# Patient Record
Sex: Male | Born: 1960 | Race: White | Hispanic: No | Marital: Married | Smoking: Former smoker
Health system: Southern US, Community
[De-identification: ages and names within clinical notes are randomized; demographics above are authoritative.]

## PROBLEM LIST (undated history)

## (undated) DIAGNOSIS — I10 Essential (primary) hypertension: Secondary | ICD-10-CM

## (undated) DIAGNOSIS — F419 Anxiety disorder, unspecified: Secondary | ICD-10-CM

## (undated) DIAGNOSIS — K219 Gastro-esophageal reflux disease without esophagitis: Secondary | ICD-10-CM

---

## 2013-11-08 ENCOUNTER — Encounter (HOSPITAL_BASED_OUTPATIENT_CLINIC_OR_DEPARTMENT_OTHER): Payer: Self-pay | Admitting: Emergency Medicine

## 2013-11-08 ENCOUNTER — Observation Stay (HOSPITAL_BASED_OUTPATIENT_CLINIC_OR_DEPARTMENT_OTHER)
Admission: EM | Admit: 2013-11-08 | Discharge: 2013-11-09 | Disposition: A | Discharge status: Home / Self Care | Payer: Worker's Compensation | Attending: Orthopedic Surgery | Admitting: Orthopedic Surgery

## 2013-11-08 ENCOUNTER — Emergency Department (HOSPITAL_BASED_OUTPATIENT_CLINIC_OR_DEPARTMENT_OTHER): Payer: Worker's Compensation

## 2013-11-08 DIAGNOSIS — Y990 Civilian activity done for income or pay: Secondary | ICD-10-CM | POA: Insufficient documentation

## 2013-11-08 DIAGNOSIS — F411 Generalized anxiety disorder: Secondary | ICD-10-CM | POA: Insufficient documentation

## 2013-11-08 DIAGNOSIS — Y9269 Other specified industrial and construction area as the place of occurrence of the external cause: Secondary | ICD-10-CM | POA: Insufficient documentation

## 2013-11-08 DIAGNOSIS — W311XXA Contact with metalworking machines, initial encounter: Secondary | ICD-10-CM | POA: Insufficient documentation

## 2013-11-08 DIAGNOSIS — F17200 Nicotine dependence, unspecified, uncomplicated: Secondary | ICD-10-CM | POA: Insufficient documentation

## 2013-11-08 DIAGNOSIS — S61209A Unspecified open wound of unspecified finger without damage to nail, initial encounter: Principal | ICD-10-CM | POA: Insufficient documentation

## 2013-11-08 DIAGNOSIS — I10 Essential (primary) hypertension: Secondary | ICD-10-CM | POA: Insufficient documentation

## 2013-11-08 DIAGNOSIS — K219 Gastro-esophageal reflux disease without esophagitis: Secondary | ICD-10-CM | POA: Insufficient documentation

## 2013-11-08 DIAGNOSIS — Z1811 Retained magnetic metal fragments: Secondary | ICD-10-CM | POA: Insufficient documentation

## 2013-11-08 DIAGNOSIS — S60552A Superficial foreign body of left hand, initial encounter: Secondary | ICD-10-CM

## 2013-11-08 HISTORY — DX: Essential (primary) hypertension: I10

## 2013-11-08 HISTORY — DX: Gastro-esophageal reflux disease without esophagitis: K21.9

## 2013-11-08 HISTORY — DX: Anxiety disorder, unspecified: F41.9

## 2013-11-08 MED ORDER — BUPIVACAINE HCL 0.5 % IJ SOLN
50.0000 mL | Freq: Once | INTRAMUSCULAR | Status: AC
  Administered 2013-11-08: 1 mL
  Filled 2013-11-08: qty 50

## 2013-11-08 MED ORDER — ONDANSETRON HCL 4 MG/2ML IJ SOLN
4.0000 mg | Freq: Once | INTRAMUSCULAR | Status: AC
  Administered 2013-11-08: 4 mg via INTRAVENOUS
  Filled 2013-11-08: qty 2

## 2013-11-08 MED ORDER — TETANUS-DIPHTH-ACELL PERTUSSIS 5-2.5-18.5 LF-MCG/0.5 IM SUSP
0.5000 mL | Freq: Once | INTRAMUSCULAR | Status: AC
  Administered 2013-11-08: 0.5 mL via INTRAMUSCULAR
  Filled 2013-11-08: qty 0.5

## 2013-11-08 MED ORDER — MORPHINE SULFATE 4 MG/ML IJ SOLN
4.0000 mg | Freq: Once | INTRAMUSCULAR | Status: AC
  Administered 2013-11-08: 4 mg via INTRAVENOUS
  Filled 2013-11-08: qty 1

## 2013-11-08 NOTE — ED Notes (Signed)
Dr Bernette Mayers in room performing digital block.

## 2013-11-08 NOTE — ED Notes (Signed)
"  drive pin punch" thru left 4th/5th digits

## 2013-11-08 NOTE — ED Notes (Signed)
Metal rod through little finger and ring finger left hand  No bleeding at present

## 2013-11-08 NOTE — ED Provider Notes (Signed)
Pt transferred from [facility] with metal FB impaled onto dorsal soft tissues of 4th and 5th proximal phalanx. Here to see Dr. Amanda Pea attempted digital block for comfort with only partial success. Pt informed of possible delay as Dr. Amanda Pea is currently in OR with another case. Pt understands.   Naviah Belfield B. Bernette Mayers, MD 11/08/13 [phone]

## 2013-11-08 NOTE — ED Notes (Signed)
Pt comfortable, awaiting surgeon's arrival.

## 2013-11-08 NOTE — ED Notes (Signed)
Pt has left the dept by POV enroute to [facility] ED.

## 2013-11-08 NOTE — ED Provider Notes (Signed)
CSN: [id number]     Arrival date & time 11/08/13  1933 History   First MD Initiated Contact with Patient 11/08/13 2031     Chief Complaint  Patient presents with  . Hand Injury   (Consider location/radiation/quality/duration/timing/severity/associated sxs/prior Treatment) Patient is a 52 y.o. male presenting with hand injury. The history is provided by the patient. No language interpreter was used.  Hand Injury Location:  Hand Time since incident:  3 hours Injury: yes   Hand location:  L palm Pain details:    Quality:  Aching   Radiates to:  Does not radiate   Severity:  Moderate   Onset quality:  Sudden   Timing:  Constant   Progression:  Worsening Chronicity:  New Dislocation: no   Prior injury to area:  No   Past Medical History  Diagnosis Date  . Hypertension   . GERD (gastroesophageal reflux disease)   . Anxiety    History reviewed. No pertinent past surgical history. No family history on file. History  Substance Use Topics  . Smoking status: Current Every Day Smoker  . Smokeless tobacco: Not on file  . Alcohol Use: No    Review of Systems  Skin: Positive for wound.  All other systems reviewed and are negative.    Allergies  Review of patient's allergies indicates no known allergies.  Home Medications   Current Outpatient Rx  Name  Route  Sig  Dispense  Refill  . LISINOPRIL PO   Oral   Take by mouth.         . OMEPRAZOLE PO   Oral   Take by mouth.          BP 142/85  Pulse 80  Temp(Src) 98.4 F (36.9 C) (Oral)  Resp 18  Ht 5\' 10"  (1.778 m)  Wt 200 lb (90.719 kg)  BMI 28.70 kg/m2  SpO2 99% Physical Exam  Nursing note and vitals reviewed. Constitutional: He is oriented to person, place, and time. He appears well-developed and well-nourished.  HENT:  Head: Normocephalic.  Eyes: Pupils are equal, round, and reactive to light.  Neck: Neck supple.  Cardiovascular: Normal rate and regular rhythm.   Pulmonary/Chest: Effort normal and  breath sounds normal.  Musculoskeletal: He exhibits tenderness.       Left hand: He exhibits tenderness. Normal sensation noted.  Broken drive pin punch in ventral soft tissue of 4th and 5th proximal phalanges.  Neurological: He is alert and oriented to person, place, and time.  Skin: Skin is warm and dry.  Psychiatric: He has a normal mood and affect. His behavior is normal. Judgment and thought content normal.    ED Course  Procedures (including critical care time) Labs Review Labs Reviewed - No data to display Imaging Review Dg Hand Complete Left  11/08/2013   CLINICAL DATA:  Left hand pain following an injury with a metallic pin driven through the left middle, ring and little fingers.  EXAM: LEFT HAND - COMPLETE 3+ VIEW  COMPARISON:  None.  FINDINGS: Linear metallic foreign body in the soft tissues ventral to the 4th and 5th proximal phalanges. This is not involving the middle finger at this time. No fracture or dislocation seen.  IMPRESSION: Linear metallic foreign body in the ventral soft tissues of the ring and index finger without bone involvement.   Electronically Signed   By: Gordan Payment M.D.   On: 11/08/2013 20:24    EKG Interpretation   None     Spoke with Dr.  Gramig regarding patient.  He requests patient transfer to [facility].  Patient received 4mg  morphine with significant improvement in pain.  No active bleeding from wound.  Transfer paperwork completed. Notified [facility] charge nurse and Dr. Bernette Mayers of patient's transfer. Patient transported POV by family to [facility] ED.  MDM  Foreign body in soft tissue of left hand.  Patient will be seen by Dr. Amanda Pea at [facility].    Jimmye Norman, NP 11/08/13 [phone]

## 2013-11-08 NOTE — ED Notes (Addendum)
Pt rec'd room A2 with wife and friend accompanying.  A piece of broken metal impaled through volar aspect of left hand, 4th and 5th fingers.  Pt pain is minimal, stated he received morphine prior to [place] [facility] [place].  Hand is elevated, no dressing in place. No bleeding noted.  Pt to see hand surgeon.  ED physician will perform block to fingers to control pain until surgeon arrives.

## 2013-11-09 ENCOUNTER — Encounter (HOSPITAL_COMMUNITY)
Admission: EM | Disposition: A | Discharge status: Home / Self Care | Payer: Self-pay | Source: Home / Self Care | Attending: Emergency Medicine

## 2013-11-09 ENCOUNTER — Emergency Department (HOSPITAL_COMMUNITY): Payer: Worker's Compensation | Admitting: Certified Registered Nurse Anesthetist

## 2013-11-09 ENCOUNTER — Encounter (HOSPITAL_COMMUNITY): Payer: Self-pay | Admitting: Anesthesiology

## 2013-11-09 ENCOUNTER — Encounter (HOSPITAL_COMMUNITY): Payer: Worker's Compensation | Admitting: Certified Registered Nurse Anesthetist

## 2013-11-09 HISTORY — PX: I & D EXTREMITY: SHX5045

## 2013-11-09 SURGERY — IRRIGATION AND DEBRIDEMENT EXTREMITY
Anesthesia: General | Site: Hand | Laterality: Left | Wound class: Dirty or Infected

## 2013-11-09 MED ORDER — GLYCOPYRROLATE 0.2 MG/ML IJ SOLN
INTRAMUSCULAR | Status: DC | PRN
  Administered 2013-11-09: 0.2 mg via INTRAVENOUS

## 2013-11-09 MED ORDER — DSS 100 MG PO CAPS: 100.0000 mg | ORAL_CAPSULE | Freq: Two times a day (BID) | ORAL | Status: DC

## 2013-11-09 MED ORDER — DEXTROSE 5 % IV SOLN
INTRAVENOUS | Status: DC | PRN
  Administered 2013-11-09: 01:00:00 via INTRAVENOUS

## 2013-11-09 MED ORDER — ONDANSETRON HCL 4 MG PO TABS: 4.0000 mg | ORAL_TABLET | Freq: Four times a day (QID) | ORAL | Status: DC | PRN

## 2013-11-09 MED ORDER — CEFAZOLIN SODIUM-DEXTROSE 2-3 GM-% IV SOLR
INTRAVENOUS | Status: DC | PRN
  Administered 2013-11-09: 2 g via INTRAVENOUS

## 2013-11-09 MED ORDER — FAMOTIDINE 20 MG PO TABS
20.0000 mg | ORAL_TABLET | Freq: Two times a day (BID) | ORAL | Status: DC | PRN
  Filled 2013-11-09: qty 1

## 2013-11-09 MED ORDER — LIDOCAINE HCL (CARDIAC) 20 MG/ML IV SOLN
INTRAVENOUS | Status: DC | PRN
  Administered 2013-11-09: 100 mg via INTRAVENOUS

## 2013-11-09 MED ORDER — MORPHINE SULFATE 2 MG/ML IJ SOLN: 1.0000 mg | INTRAMUSCULAR | Status: DC | PRN

## 2013-11-09 MED ORDER — OXYCODONE HCL 5 MG PO TABS
5.0000 mg | ORAL_TABLET | ORAL | Status: DC | PRN
  Administered 2013-11-09: 10 mg via ORAL
  Filled 2013-11-09: qty 2

## 2013-11-09 MED ORDER — METHOCARBAMOL 500 MG PO TABS: 500.0000 mg | ORAL_TABLET | Freq: Four times a day (QID) | ORAL | Status: DC | PRN

## 2013-11-09 MED ORDER — ASCORBIC ACID 1000 MG PO TABS: 1000.0000 mg | ORAL_TABLET | Freq: Every day | ORAL | Status: DC

## 2013-11-09 MED ORDER — PROPOFOL 10 MG/ML IV BOLUS
INTRAVENOUS | Status: DC | PRN
  Administered 2013-11-09: 200 mg via INTRAVENOUS

## 2013-11-09 MED ORDER — FENTANYL CITRATE 0.05 MG/ML IJ SOLN
INTRAMUSCULAR | Status: DC | PRN
  Administered 2013-11-09 (×2): 50 ug via INTRAVENOUS

## 2013-11-09 MED ORDER — HYDROMORPHONE HCL PF 1 MG/ML IJ SOLN
0.2500 mg | INTRAMUSCULAR | Status: DC | PRN
  Administered 2013-11-09 (×3): 0.5 mg via INTRAVENOUS

## 2013-11-09 MED ORDER — PHENYLEPHRINE HCL 10 MG/ML IJ SOLN
INTRAMUSCULAR | Status: DC | PRN
  Administered 2013-11-09 (×3): 40 ug via INTRAVENOUS

## 2013-11-09 MED ORDER — LACTATED RINGERS IV SOLN
INTRAVENOUS | Status: DC
  Administered 2013-11-09: 03:00:00 via INTRAVENOUS

## 2013-11-09 MED ORDER — LACTATED RINGERS IV SOLN
INTRAVENOUS | Status: DC | PRN
  Administered 2013-11-09 (×2): via INTRAVENOUS

## 2013-11-09 MED ORDER — PANTOPRAZOLE SODIUM 40 MG PO TBEC: 40.0000 mg | DELAYED_RELEASE_TABLET | Freq: Every day | ORAL | Status: DC

## 2013-11-09 MED ORDER — ONDANSETRON HCL 4 MG/2ML IJ SOLN
INTRAMUSCULAR | Status: DC | PRN
  Administered 2013-11-09: 4 mg via INTRAVENOUS

## 2013-11-09 MED ORDER — CEFAZOLIN SODIUM 1-5 GM-% IV SOLN
1.0000 g | Freq: Three times a day (TID) | INTRAVENOUS | Status: DC
  Administered 2013-11-09: 1 g via INTRAVENOUS
  Filled 2013-11-09 (×3): qty 50

## 2013-11-09 MED ORDER — VITAMIN C 500 MG PO TABS
1000.0000 mg | ORAL_TABLET | Freq: Every day | ORAL | Status: DC
  Filled 2013-11-09: qty 2

## 2013-11-09 MED ORDER — ALPRAZOLAM 0.5 MG PO TABS: 0.5000 mg | ORAL_TABLET | Freq: Four times a day (QID) | ORAL | Status: DC | PRN

## 2013-11-09 MED ORDER — SODIUM CHLORIDE 0.9 % IR SOLN
Status: DC | PRN
  Administered 2013-11-09: 3000 mL

## 2013-11-09 MED ORDER — ONDANSETRON HCL 4 MG/2ML IJ SOLN: 4.0000 mg | Freq: Once | INTRAMUSCULAR | Status: DC | PRN

## 2013-11-09 MED ORDER — OXYCODONE HCL 5 MG PO TABS: 5.0000 mg | ORAL_TABLET | ORAL | Status: DC | PRN

## 2013-11-09 MED ORDER — CIPROFLOXACIN HCL 500 MG PO TABS: 500.0000 mg | ORAL_TABLET | Freq: Two times a day (BID) | ORAL | Status: DC

## 2013-11-09 MED ORDER — ONDANSETRON HCL 4 MG/2ML IJ SOLN: 4.0000 mg | Freq: Four times a day (QID) | INTRAMUSCULAR | Status: DC | PRN

## 2013-11-09 MED ORDER — DOCUSATE SODIUM 100 MG PO CAPS
100.0000 mg | ORAL_CAPSULE | Freq: Two times a day (BID) | ORAL | Status: DC
  Filled 2013-11-09 (×2): qty 1

## 2013-11-09 SURGICAL SUPPLY — 39 items
BANDAGE CONFORM 2  STR LF (GAUZE/BANDAGES/DRESSINGS) IMPLANT
BANDAGE ELASTIC 4 VELCRO ST LF (GAUZE/BANDAGES/DRESSINGS) ×2 IMPLANT
BANDAGE GAUZE ELAST BULKY 4 IN (GAUZE/BANDAGES/DRESSINGS) ×2 IMPLANT
CLOTH BEACON ORANGE TIMEOUT ST (SAFETY) ×2 IMPLANT
CORDS BIPOLAR (ELECTRODE) ×2 IMPLANT
CUFF TOURNIQUET SINGLE 18IN (TOURNIQUET CUFF) ×2 IMPLANT
CUFF TOURNIQUET SINGLE 24IN (TOURNIQUET CUFF) IMPLANT
DRSG ADAPTIC 3X8 NADH LF (GAUZE/BANDAGES/DRESSINGS) ×2 IMPLANT
ELECT REM PT RETURN 9FT ADLT (ELECTROSURGICAL)
ELECTRODE REM PT RTRN 9FT ADLT (ELECTROSURGICAL) IMPLANT
GAUZE XEROFORM 1X8 LF (GAUZE/BANDAGES/DRESSINGS) ×2 IMPLANT
GLOVE BIOGEL M STRL SZ7.5 (GLOVE) ×2 IMPLANT
GLOVE SS BIOGEL STRL SZ 8 (GLOVE) ×1 IMPLANT
GLOVE SUPERSENSE BIOGEL SZ 8 (GLOVE) ×1
GOWN PREVENTION PLUS XLARGE (GOWN DISPOSABLE) ×2 IMPLANT
GOWN STRL NON-REIN LRG LVL3 (GOWN DISPOSABLE) ×6 IMPLANT
GOWN STRL REIN XL XLG (GOWN DISPOSABLE) ×4 IMPLANT
HANDPIECE INTERPULSE COAX TIP (DISPOSABLE)
KIT BASIN OR (CUSTOM PROCEDURE TRAY) ×2 IMPLANT
KIT ROOM TURNOVER OR (KITS) ×2 IMPLANT
MANIFOLD NEPTUNE II (INSTRUMENTS) ×2 IMPLANT
NDL HYPO 25GX1X1/2 BEV (NEEDLE) IMPLANT
NEEDLE HYPO 25GX1X1/2 BEV (NEEDLE) IMPLANT
NS IRRIG 1000ML POUR BTL (IV SOLUTION) ×2 IMPLANT
PACK ORTHO EXTREMITY (CUSTOM PROCEDURE TRAY) ×2 IMPLANT
PAD ARMBOARD 7.5X6 YLW CONV (MISCELLANEOUS) ×4 IMPLANT
PAD CAST 4YDX4 CTTN HI CHSV (CAST SUPPLIES) ×1 IMPLANT
PADDING CAST COTTON 4X4 STRL (CAST SUPPLIES) ×2
SET HNDPC FAN SPRY TIP SCT (DISPOSABLE) IMPLANT
SPONGE GAUZE 4X4 12PLY (GAUZE/BANDAGES/DRESSINGS) ×2 IMPLANT
SPONGE LAP 18X18 X RAY DECT (DISPOSABLE) ×2 IMPLANT
SPONGE LAP 4X18 X RAY DECT (DISPOSABLE) ×2 IMPLANT
SYR CONTROL 10ML LL (SYRINGE) IMPLANT
TOWEL OR 17X24 6PK STRL BLUE (TOWEL DISPOSABLE) ×2 IMPLANT
TOWEL OR 17X26 10 PK STRL BLUE (TOWEL DISPOSABLE) ×2 IMPLANT
TUBE ANAEROBIC SPECIMEN COL (MISCELLANEOUS) IMPLANT
TUBE CONNECTING 12X1/4 (SUCTIONS) ×2 IMPLANT
WATER STERILE IRR 1000ML POUR (IV SOLUTION) ×2 IMPLANT
YANKAUER SUCT BULB TIP NO VENT (SUCTIONS) ×2 IMPLANT

## 2013-11-09 NOTE — Transfer of Care (Signed)
Immediate Anesthesia Transfer of Care Note  Patient: Ronald Evans  Procedure(s) Performed: Procedure(s): IRRIGATION AND DEBRIDEMENT left hand, removal of foreign body (Left)  Patient Location: PACU  Anesthesia Type:General  Level of Consciousness: awake and patient cooperative  Airway & Oxygen Therapy: Patient Spontanous Breathing and Patient connected to nasal cannula oxygen  Post-op Assessment: Report given to PACU RN and Post -op Vital signs reviewed and stable  Post vital signs: Reviewed and stable  Complications: No apparent anesthesia complications

## 2013-11-09 NOTE — Anesthesia Procedure Notes (Signed)
Procedure Name: LMA Insertion Date/Time: 11/09/2013 1:10 AM Performed by: Julianne Rice Z Pre-anesthesia Checklist: Patient identified, Timeout performed, Emergency Drugs available, Suction available and Patient being monitored Patient Re-evaluated:Patient Re-evaluated prior to inductionOxygen Delivery Method: Circle system utilized Preoxygenation: Pre-oxygenation with 100% oxygen Intubation Type: IV induction Ventilation: Mask ventilation without difficulty LMA Size: 4.0 Number of attempts: 1 Placement Confirmation: breath sounds checked- equal and bilateral and positive ETCO2 Tube secured with: Tape Dental Injury: Teeth and Oropharynx as per pre-operative assessment

## 2013-11-09 NOTE — Anesthesia Postprocedure Evaluation (Signed)
  Anesthesia Post-op Note  Patient: Ronald Evans  Procedure(s) Performed: Procedure(s): IRRIGATION AND DEBRIDEMENT left hand, removal of foreign body (Left)  Patient Location: PACU  Anesthesia Type:General  Level of Consciousness: awake, oriented, sedated and patient cooperative  Airway and Oxygen Therapy: Patient Spontanous Breathing  Post-op Pain: none  Post-op Assessment: Post-op Vital signs reviewed, Patient's Cardiovascular Status Stable, Respiratory Function Stable, Patent Airway, No signs of Nausea or vomiting and Pain level controlled  Post-op Vital Signs: stable  Complications: No apparent anesthesia complications

## 2013-11-09 NOTE — Progress Notes (Signed)
Patient discharged home, discharge instruction give  And patient verbalized understanding . Patient's wife was present bedside during instruction time. Patient has no question or concern and patient repeated instruction after teachback method was used for the instuctions.

## 2013-11-09 NOTE — H&P (Signed)
Ronald Evans is an 52 y.o. male.   Chief Complaint: Foreign body left hand HPI: Patient is a pleasant 52 year old male who presents to the emergency room setting for evaluation of his left hand after sustaining an on-the-job injury. He is employed at [employer], and while using a die punch, after striking this with a hammer, the  punch broke impalling the small and ring finger of the left hand. The patient was seen and evaluated and given a digital block in the emergency room setting prior to our examination. He does state that he had full sensation of the small and ring finger prior to the digital block. He denies any other injury.  Past Medical History  Diagnosis Date  . Hypertension   . GERD (gastroesophageal reflux disease)   . Anxiety     History reviewed. No pertinent past surgical history.  No family history on file. Social History:  reports that he has been smoking.  He does not have any smokeless tobacco history on file. He reports that he does not drink alcohol or use illicit drugs.  Allergies: No Known Allergies   (Not in a hospital admission)  No results found for this or any previous visit (from the past 48 hour(s)). Dg Hand Complete Left  11/08/2013   CLINICAL DATA:  Left hand pain following an injury with a metallic pin driven through the left middle, ring and little fingers.  EXAM: LEFT HAND - COMPLETE 3+ VIEW  COMPARISON:  None.  FINDINGS: Linear metallic foreign body in the soft tissues ventral to the 4th and 5th proximal phalanges. This is not involving the middle finger at this time. No fracture or dislocation seen.  IMPRESSION: Linear metallic foreign body in the ventral soft tissues of the ring and index finger without bone involvement.   Electronically Signed   By: Gordan Payment M.D.   On: 11/08/2013 20:24    Review of Systems  Constitutional: Negative.   HENT: Negative.   Eyes: Negative.   Respiratory: Negative.   Cardiovascular: Negative.    Gastrointestinal: Negative.   Genitourinary: Negative.   Musculoskeletal:       See history of present illness  Skin: Negative.   Neurological: Negative.   Endo/Heme/Allergies: Negative.   Psychiatric/Behavioral: Negative.     Blood pressure 162/85, pulse 62, temperature 98.2 F (36.8 C), temperature source Oral, resp. rate 15, height 5\' 10"  (1.778 m), weight 90.719 kg (200 lb), SpO2 98.00%. Physical Exam  ..The patient is alert and oriented in no acute distress the patient complains of pain in the affected upper extremity.  The patient is noted to have a normal HEENT exam.  Lung fields show equal chest expansion and no shortness of breath  abdomen exam is nontender without distention.  Lower extremity examination does not show any fracture dislocation or blood clot symptoms.  Pelvis is stable neck and back are stable and nontender Evaluation left hand shows that he has a large metallic punch that has saturated the small finger and ring finger just the level of the PIPs. The fingers are held in flexion. States he has full sensation to the small finger it appears his FDP is intact it is difficult to test, the ring finger isn't numb prescription he did sustain a digital block prior to examination, it appears his FDP is intact, once again the FDS to the ring finger is also difficult to ascertain functions. His refills intact to small and ring finger.  Assessment/Plan Foreign body left small and ring  finger .Marland KitchenWe are planning surgery for your upper extremity. The risk and benefits of surgery include risk of bleeding infection anesthesia damage to normal structures and failure of the surgery to accomplish its intended goals of relieving symptoms and restoring function with this in mind we'll going to proceed. I have specifically discussed with the patient the pre-and postoperative regime and the does and don'ts and risk and benefits in great detail. Risk and benefits of surgery also include risk  of dystrophy chronic nerve pain failure of the healing process to go onto completion and other inherent risks of surgery The relavent the pathophysiology of the disease/injury process, as well as the alternatives for treatment and postoperative course of action has been discussed in great detail with the patient who desires to proceed.  We will do everything in our power to help you (the patient) restore function to the upper extremity. Is a pleasure to see this patient today.   [name] L 11/09/2013, 12:29 AM

## 2013-11-09 NOTE — Anesthesia Preprocedure Evaluation (Addendum)
Anesthesia Evaluation  Patient identified by MRN, date of birth, ID band Patient awake    Reviewed: Allergy & Precautions, H&P , NPO status , Patient's Chart, lab work & pertinent test results  Airway       Dental   Pulmonary Current Smoker,          Cardiovascular hypertension,     Neuro/Psych Anxiety    GI/Hepatic GERD-  ,  Endo/Other    Renal/GU      Musculoskeletal   Abdominal   Peds  Hematology   Anesthesia Other Findings   Reproductive/Obstetrics                         Anesthesia Physical Anesthesia Plan  ASA: II  Anesthesia Plan: General   Post-op Pain Management:    Induction: Intravenous  Airway Management Planned: LMA  Additional Equipment:   Intra-op Plan:   Post-operative Plan: Extubation in OR  Informed Consent: I have reviewed the patients History and Physical, chart, labs and discussed the procedure including the risks, benefits and alternatives for the proposed anesthesia with the patient or authorized representative who has indicated his/her understanding and acceptance.     Plan Discussed with: CRNA, Anesthesiologist and Surgeon  Anesthesia Plan Comments:         Anesthesia Quick Evaluation

## 2013-11-09 NOTE — Care Management Note (Signed)
    Page 1 of 1   11/09/2013     11:08:32 AM   CARE MANAGEMENT NOTE 11/09/2013  Patient:  Ronald Evans, Ronald Evans   Account Number:  [id number]  Date Initiated:  11/09/2013  Documentation initiated by:  Letha Cape  Subjective/Objective Assessment:   dx foreign body of left hand  admit- lives with brother. pta indep.     Action/Plan:   Anticipated DC Date:  11/09/2013   Anticipated DC Plan:  HOME/SELF CARE      DC Planning Services  CM consult      Choice offered to / List presented to:             Status of service:  Completed, signed off Medicare Important Message given?   (If response is "NO", the following Medicare IM given date fields will be blank) Date Medicare IM given:   Date Additional Medicare IM given:    Discharge Disposition:  HOME/SELF CARE  Per UR Regulation:  Reviewed for med. necessity/level of care/duration of stay  If discussed at Long Length of Stay Meetings, dates discussed:    Comments:  11/09/13 11:07 Letha Cape RN, BSN [phone] patient lives with brother, pta indep.  No needs anticipated.

## 2013-11-09 NOTE — ED Notes (Signed)
Ronnald Ramp PA-C into room to see patient.  Explained pt to go to OR tonight, remain in hospital tomorrow for IV antibiotics, the be discharged home.  Pt voiced understanding of information.  Clothing removed, placed in bag and given to wife.

## 2013-11-09 NOTE — ED Notes (Signed)
Pt to OR.

## 2013-11-09 NOTE — Op Note (Signed)
See Dictation #[id number] Amanda Pea Md

## 2013-11-09 NOTE — ED Notes (Signed)
Pt to go to OR tonight, then be admitted for IV antibiotics.

## 2013-11-09 NOTE — Progress Notes (Signed)
Patient was brought to the floor from PACU. Patient was lethargic but alert and oriented. Patient skin is in tact with a dressing on his right forearm. Capillary refill assessed and charted on left pinky, ring and middle finger. Patient was oriented to unit, bed is at lowest position and call bell is within reach. Will continue to monitor.  Marcelyn Bruins RN

## 2013-11-09 NOTE — Anesthesia Postprocedure Evaluation (Signed)
  Anesthesia Post-op Note  Patient: Ronald Evans  Procedure(s) Performed: Procedure(s): IRRIGATION AND DEBRIDEMENT left hand, removal of foreign body (Left)  Patient Location: PACU  Anesthesia Type:General  Level of Consciousness: awake, alert , oriented and patient cooperative  Airway and Oxygen Therapy: Patient Spontanous Breathing  Post-op Pain: mild  Post-op Assessment: Post-op Vital signs reviewed, Patient's Cardiovascular Status Stable, Respiratory Function Stable, Patent Airway, No signs of Nausea or vomiting and Pain level controlled  Post-op Vital Signs: stable  Complications: No apparent anesthesia complications

## 2013-11-10 NOTE — Op Note (Signed)
Ronald Evans, Ronald Evans NO.:  [id number]  MEDICAL RECORD NO.:  [id number]  LOCATION:  [id number]                        FACILITY:  [facility]  PHYSICIAN:  Dionne Ano. Giacomo Valone, M.D.DATE OF BIRTH:  [birth year]  DATE OF PROCEDURE: DATE OF DISCHARGE:  11/09/2013                              OPERATIVE REPORT   PREOPERATIVE DIAGNOSES:  Status post punch press injury to the left hand with retained foreign body and soft tissue disarray.  POSTOPERATIVE DIAGNOSES:  Status post punch press injury to the left hand with retained foreign body and soft tissue disarray.  PROCEDURE: 1. Removal of foreign body, left small finger. 2. Removal of foreign body, left ring finger. 3. Irrigation debridement of skin, subcutaneous tissue, left ring     finger. 4. Irrigation debridement of skin, subcutaneous tissue, left small     finger. 5. Exploration of ring and small finger, volar wounds with intact     vascularity and stable soft tissue parameters.  SURGEON:  Dionne Ano. Amanda Pea, M.D.  ASSISTANT:  None.  COMPLICATIONS:  None.  ANESTHESIA:  General.  TOURNIQUET TIME:  Zero.  INDICATIONS:  The patient is a pleasant male who presents with the above- mentioned diagnosis.  I have counseled him in regard to risks and benefits of surgery and he desires to proceed.  He had a significant injury with a punch press with retained foreign body about the ring and small fingers as well as soft tissue injury.  He understands the risks and benefits and desires to proceed with surgical algorithm of care.  DESCRIPTION OF PROCEDURE:  The patient was seen by myself and Anesthesia, taken to the operative suite, and underwent a smooth induction of general anesthesia, laid supine, appropriately prepped and draped in a sterile fashion by  Betadine scrub and paint.  Once this done, the patient had a very careful removal of foreign body from the ring finger followed by removal of foreign body from the small  finger. __________ 2 separate foreign body removals about the fingers.  Following this, the large metallic object was removed from the field and later given to the patient.  At this time, we performed I and D of 2 areas both radially and ulnarly about the small finger and radially and ulnarly about the ring finger. Greater than 3 L of fluid were placed through and through.  The areas in question Penn rose/vessel loop drains were placed through-and-through the wounds and I irrigated very aggressively to hopefully prevent any infection.  Exploration of vascular structures revealed intact vascularity.  No complicating features.  No signs of infection, dystrophy, or vascular compromise.  Thorough exploration of the fingers about the ring and small was performed.  Thus, foreign body removal x2, I and D of the ring finger and small finger, skin, and subcutaneous tissue and exploration was accomplished without difficulty.  The patient tolerated this well.  Vessel loop drains were placed in the wounds.  He will be continued on IV antibiotics.  In general, postop precautions and will see him back in the office in the days ahead as his progress __________.  I will keep him overnight for IV antibiotics, given the significant injury.  These notes  have been discussed and all questions have been encouraged and answered.     Dionne Ano. Amanda Pea, M.D.     [facility]  D:  11/09/2013  T:  11/09/2013  Job:  [id number]

## 2013-11-12 ENCOUNTER — Encounter (HOSPITAL_COMMUNITY): Payer: Self-pay | Admitting: Orthopedic Surgery

## 2013-11-12 NOTE — ED Provider Notes (Signed)
Medical screening examination/treatment/procedure(s) were performed by non-physician practitioner and as supervising physician I was immediately available for consultation/collaboration.  EKG Interpretation     Ventricular Rate:    PR Interval:    QRS Duration:   QT Interval:    QTC Calculation:   R Axis:     Text Interpretation:                Shelda Jakes, MD 11/12/13 [phone]

## 2013-11-30 ENCOUNTER — Encounter (HOSPITAL_COMMUNITY): Payer: Self-pay | Admitting: *Deleted

## 2013-11-30 ENCOUNTER — Other Ambulatory Visit (HOSPITAL_COMMUNITY): Payer: Self-pay | Admitting: *Deleted

## 2013-11-30 ENCOUNTER — Other Ambulatory Visit: Payer: Self-pay | Admitting: Orthopedic Surgery

## 2013-11-30 ENCOUNTER — Encounter (HOSPITAL_COMMUNITY): Payer: Self-pay | Admitting: Pharmacy Technician

## 2013-11-30 MED ORDER — CEFAZOLIN SODIUM-DEXTROSE 2-3 GM-% IV SOLR
2.0000 g | INTRAVENOUS | Status: AC
  Administered 2013-12-01: 2 g via INTRAVENOUS
  Filled 2013-11-30: qty 50

## 2013-11-30 MED ORDER — CHLORHEXIDINE GLUCONATE 4 % EX LIQD: 60.0000 mL | Freq: Once | CUTANEOUS | Status: DC

## 2013-12-01 ENCOUNTER — Ambulatory Visit (HOSPITAL_COMMUNITY): Payer: Worker's Compensation

## 2013-12-01 ENCOUNTER — Ambulatory Visit (HOSPITAL_COMMUNITY)
Admission: RE | Admit: 2013-12-01 | Payer: Self-pay | Source: Other Acute Inpatient Hospital | Admitting: Orthopedic Surgery

## 2013-12-01 ENCOUNTER — Encounter (HOSPITAL_COMMUNITY): Payer: Self-pay | Admitting: *Deleted

## 2013-12-01 ENCOUNTER — Encounter (HOSPITAL_COMMUNITY): Payer: Worker's Compensation | Admitting: Anesthesiology

## 2013-12-01 ENCOUNTER — Ambulatory Visit (HOSPITAL_COMMUNITY)
Admission: RE | Admit: 2013-12-01 | Discharge: 2013-12-01 | Disposition: A | Discharge status: Home / Self Care | Payer: Worker's Compensation | Source: Ambulatory Visit | Attending: Orthopedic Surgery | Admitting: Orthopedic Surgery

## 2013-12-01 ENCOUNTER — Ambulatory Visit (HOSPITAL_COMMUNITY): Payer: Worker's Compensation | Admitting: Anesthesiology

## 2013-12-01 ENCOUNTER — Encounter (HOSPITAL_COMMUNITY)
Admission: RE | Disposition: A | Discharge status: Home / Self Care | Payer: Self-pay | Source: Ambulatory Visit | Attending: Orthopedic Surgery

## 2013-12-01 DIAGNOSIS — X58XXXA Exposure to other specified factors, initial encounter: Secondary | ICD-10-CM | POA: Insufficient documentation

## 2013-12-01 DIAGNOSIS — Z87891 Personal history of nicotine dependence: Secondary | ICD-10-CM | POA: Insufficient documentation

## 2013-12-01 DIAGNOSIS — S61209A Unspecified open wound of unspecified finger without damage to nail, initial encounter: Secondary | ICD-10-CM | POA: Insufficient documentation

## 2013-12-01 DIAGNOSIS — I10 Essential (primary) hypertension: Secondary | ICD-10-CM | POA: Insufficient documentation

## 2013-12-01 HISTORY — PX: TENDON EXPLORATION: SHX5112

## 2013-12-01 LAB — BASIC METABOLIC PANEL
BUN: 9 mg/dL (ref 6–23)
CO2: 26 mEq/L (ref 19–32)
Calcium: 9.1 mg/dL (ref 8.4–10.5)
Chloride: 111 mEq/L (ref 96–112)
Glucose, Bld: 142 mg/dL — ABNORMAL HIGH (ref 70–99)
Potassium: 3.9 mEq/L (ref 3.5–5.1)
Sodium: 141 mEq/L (ref 135–145)

## 2013-12-01 LAB — CBC
HCT: 43.2 % (ref 39.0–52.0)
Hemoglobin: 15.5 g/dL (ref 13.0–17.0)
MCH: 32.4 pg (ref 26.0–34.0)
MCV: 90.4 fL (ref 78.0–100.0)
Platelets: 211 10*3/uL (ref 150–400)
RBC: 4.78 MIL/uL (ref 4.22–5.81)
WBC: 7 10*3/uL (ref 4.0–10.5)

## 2013-12-01 SURGERY — EXPLORATION, TENDON
Anesthesia: Monitor Anesthesia Care | Site: Finger | Laterality: Left

## 2013-12-01 MED ORDER — MIDAZOLAM HCL 2 MG/2ML IJ SOLN
2.0000 mg | Freq: Once | INTRAMUSCULAR | Status: AC
  Administered 2013-12-01: 2 mg via INTRAVENOUS

## 2013-12-01 MED ORDER — OXYCODONE HCL 5 MG PO TABS: 5.0000 mg | ORAL_TABLET | ORAL | Status: AC | PRN

## 2013-12-01 MED ORDER — LACTATED RINGERS IV SOLN
INTRAVENOUS | Status: DC | PRN
  Administered 2013-12-01: 10:00:00 via INTRAVENOUS

## 2013-12-01 MED ORDER — HYDROMORPHONE HCL PF 1 MG/ML IJ SOLN: 0.2500 mg | INTRAMUSCULAR | Status: DC | PRN

## 2013-12-01 MED ORDER — ONDANSETRON HCL 4 MG/2ML IJ SOLN: 4.0000 mg | Freq: Once | INTRAMUSCULAR | Status: DC | PRN

## 2013-12-01 MED ORDER — ONDANSETRON HCL 4 MG/2ML IJ SOLN
INTRAMUSCULAR | Status: DC | PRN
  Administered 2013-12-01: 4 mg via INTRAVENOUS

## 2013-12-01 MED ORDER — SODIUM CHLORIDE 0.9 % IR SOLN
Status: DC | PRN
  Administered 2013-12-01: 1000 mL

## 2013-12-01 MED ORDER — LACTATED RINGERS IV SOLN
INTRAVENOUS | Status: DC
  Administered 2013-12-01: 09:00:00 via INTRAVENOUS

## 2013-12-01 MED ORDER — PROPOFOL 10 MG/ML IV BOLUS
INTRAVENOUS | Status: DC | PRN
  Administered 2013-12-01: 200 mg via INTRAVENOUS

## 2013-12-01 MED ORDER — MIDAZOLAM HCL 2 MG/2ML IJ SOLN
INTRAMUSCULAR | Status: AC
  Administered 2013-12-01: 2 mg via INTRAVENOUS
  Filled 2013-12-01: qty 2

## 2013-12-01 MED ORDER — FENTANYL CITRATE 0.05 MG/ML IJ SOLN
100.0000 ug | Freq: Once | INTRAMUSCULAR | Status: AC
  Administered 2013-12-01: 100 ug via INTRAVENOUS

## 2013-12-01 MED ORDER — SODIUM CHLORIDE 0.45 % IV SOLN: INTRAVENOUS | Status: DC

## 2013-12-01 MED ORDER — FENTANYL CITRATE 0.05 MG/ML IJ SOLN
INTRAMUSCULAR | Status: AC
  Administered 2013-12-01: 100 ug via INTRAVENOUS
  Filled 2013-12-01: qty 2

## 2013-12-01 MED ORDER — CEPHALEXIN 500 MG PO CAPS: 500.0000 mg | ORAL_CAPSULE | Freq: Four times a day (QID) | ORAL | Status: AC

## 2013-12-01 MED ORDER — BUPIVACAINE HCL (PF) 0.25 % IJ SOLN
INTRAMUSCULAR | Status: AC
  Filled 2013-12-01: qty 30

## 2013-12-01 MED ORDER — EPHEDRINE SULFATE 50 MG/ML IJ SOLN
INTRAMUSCULAR | Status: DC | PRN
  Administered 2013-12-01: 10 mg via INTRAVENOUS

## 2013-12-01 MED ORDER — FENTANYL CITRATE 0.05 MG/ML IJ SOLN
INTRAMUSCULAR | Status: DC | PRN
  Administered 2013-12-01 (×2): 50 ug via INTRAVENOUS

## 2013-12-01 MED ORDER — LIDOCAINE HCL (CARDIAC) 20 MG/ML IV SOLN
INTRAVENOUS | Status: DC | PRN
  Administered 2013-12-01: 40 mg via INTRAVENOUS

## 2013-12-01 MED ORDER — MIDAZOLAM HCL 5 MG/5ML IJ SOLN
INTRAMUSCULAR | Status: DC | PRN
  Administered 2013-12-01: 2 mg via INTRAVENOUS

## 2013-12-01 SURGICAL SUPPLY — 54 items
BANDAGE CONFORM 2  STR LF (GAUZE/BANDAGES/DRESSINGS) ×1 IMPLANT
BANDAGE ELASTIC 3 VELCRO ST LF (GAUZE/BANDAGES/DRESSINGS) ×1 IMPLANT
BANDAGE ELASTIC 4 VELCRO ST LF (GAUZE/BANDAGES/DRESSINGS) ×1 IMPLANT
BANDAGE GAUZE ELAST BULKY 4 IN (GAUZE/BANDAGES/DRESSINGS) ×1 IMPLANT
BNDG COHESIVE 1X5 TAN STRL LF (GAUZE/BANDAGES/DRESSINGS) IMPLANT
CLOTH BEACON ORANGE TIMEOUT ST (SAFETY) ×2 IMPLANT
CORDS BIPOLAR (ELECTRODE) ×2 IMPLANT
COVER SURGICAL LIGHT HANDLE (MISCELLANEOUS) ×2 IMPLANT
CUFF TOURNIQUET SINGLE 18IN (TOURNIQUET CUFF) ×2 IMPLANT
CUFF TOURNIQUET SINGLE 24IN (TOURNIQUET CUFF) IMPLANT
DECANTER SPIKE VIAL GLASS SM (MISCELLANEOUS) ×2 IMPLANT
DRAPE SURG 17X23 STRL (DRAPES) ×2 IMPLANT
DRSG EMULSION OIL 3X3 NADH (GAUZE/BANDAGES/DRESSINGS) ×1 IMPLANT
GAUZE SPONGE 2X2 8PLY STRL LF (GAUZE/BANDAGES/DRESSINGS) IMPLANT
GAUZE XEROFORM 1X8 LF (GAUZE/BANDAGES/DRESSINGS) ×1 IMPLANT
GLOVE BIOGEL M STRL SZ7.5 (GLOVE) ×2 IMPLANT
GLOVE SS BIOGEL STRL SZ 8 (GLOVE) ×1 IMPLANT
GLOVE SUPERSENSE BIOGEL SZ 8 (GLOVE) ×1
GOWN PREVENTION PLUS XLARGE (GOWN DISPOSABLE) ×2 IMPLANT
GOWN STRL NON-REIN LRG LVL3 (GOWN DISPOSABLE) ×4 IMPLANT
GOWN STRL REIN XL XLG (GOWN DISPOSABLE) ×4 IMPLANT
KIT BASIN OR (CUSTOM PROCEDURE TRAY) ×2 IMPLANT
KIT ROOM TURNOVER OR (KITS) ×2 IMPLANT
LOOP VESSEL MAXI BLUE (MISCELLANEOUS) IMPLANT
MANIFOLD NEPTUNE II (INSTRUMENTS) ×2 IMPLANT
NDL HYPO 25GX1X1/2 BEV (NEEDLE) IMPLANT
NEEDLE HYPO 25GX1X1/2 BEV (NEEDLE) IMPLANT
NS IRRIG 1000ML POUR BTL (IV SOLUTION) ×2 IMPLANT
PACK ORTHO EXTREMITY (CUSTOM PROCEDURE TRAY) ×2 IMPLANT
PAD ARMBOARD 7.5X6 YLW CONV (MISCELLANEOUS) ×4 IMPLANT
PAD CAST 4YDX4 CTTN HI CHSV (CAST SUPPLIES) IMPLANT
PADDING CAST COTTON 4X4 STRL (CAST SUPPLIES) ×2
SOLUTION BETADINE 4OZ (MISCELLANEOUS) ×2 IMPLANT
SPEAR EYE SURG WECK-CEL (MISCELLANEOUS) IMPLANT
SPECIMEN JAR SMALL (MISCELLANEOUS) ×2 IMPLANT
SPLINT FIBERGLASS 4X30 (CAST SUPPLIES) ×1 IMPLANT
SPLINT FINGER (SOFTGOODS) ×1 IMPLANT
SPONGE GAUZE 2X2 STER 10/PKG (GAUZE/BANDAGES/DRESSINGS)
SPONGE GAUZE 4X4 12PLY (GAUZE/BANDAGES/DRESSINGS) ×1 IMPLANT
SPONGE SCRUB IODOPHOR (GAUZE/BANDAGES/DRESSINGS) ×2 IMPLANT
SUCTION FRAZIER TIP 10 FR DISP (SUCTIONS) IMPLANT
SUT FIBERWIRE 3-0 18 TAPR NDL (SUTURE) ×8
SUT MERSILENE 4 0 P 3 (SUTURE) IMPLANT
SUT PROLENE 4 0 PS 2 18 (SUTURE) ×1 IMPLANT
SUT PROLENE 5 0 PS 2 (SUTURE) ×2 IMPLANT
SUT VIC AB 2-0 CT1 27 (SUTURE)
SUT VIC AB 2-0 CT1 TAPERPNT 27 (SUTURE) IMPLANT
SUTURE FIBERWR 3-0 18 TAPR NDL (SUTURE) IMPLANT
SYR CONTROL 10ML LL (SYRINGE) IMPLANT
TOWEL OR 17X24 6PK STRL BLUE (TOWEL DISPOSABLE) ×2 IMPLANT
TOWEL OR 17X26 10 PK STRL BLUE (TOWEL DISPOSABLE) ×2 IMPLANT
TUBE CONNECTING 12X1/4 (SUCTIONS) IMPLANT
UNDERPAD 30X30 INCONTINENT (UNDERPADS AND DIAPERS) ×2 IMPLANT
WATER STERILE IRR 1000ML POUR (IV SOLUTION) ×2 IMPLANT

## 2013-12-01 NOTE — Op Note (Signed)
See Dictation #[id number] Amanda Pea MD

## 2013-12-01 NOTE — Progress Notes (Signed)
12/01/13 0803  OBSTRUCTIVE SLEEP APNEA  Have you ever been diagnosed with sleep apnea through a sleep study? No  Do you snore loudly (loud enough to be heard through closed doors)?  1  Do you often feel tired, fatigued, or sleepy during the daytime? 0  Has anyone observed you stop breathing during your sleep? 0  Do you have, or are you being treated for high blood pressure? 1  BMI more than 35 kg/m2? 0  Age over 51 years old? 1  Neck circumference greater than 40 cm/18 inches? 0  Gender: 1  Obstructive Sleep Apnea Score 4  Score 4 or greater  Results sent to PCP

## 2013-12-01 NOTE — Anesthesia Postprocedure Evaluation (Signed)
  Anesthesia Post-op Note  Patient: Ronald Evans  Procedure(s) Performed: Procedure(s): EXPLORATION LEFT SMALL FINGER WITH FLEXOR TENDONS (FDP,FDS) REPAIR and neurolysis of digital nerves (Left)  Patient Location: PACU  Anesthesia Type:General and GA combined with regional for post-op pain  Level of Consciousness: awake, alert  and oriented  Airway and Oxygen Therapy: Patient Spontanous Breathing  Post-op Pain: none  Post-op Assessment: Post-op Vital signs reviewed, Patient's Cardiovascular Status Stable, Respiratory Function Stable, Patent Airway and Pain level controlled  Post-op Vital Signs: stable  Complications: No apparent anesthesia complications

## 2013-12-01 NOTE — Anesthesia Preprocedure Evaluation (Addendum)
Anesthesia Evaluation  Patient identified by MRN, date of birth, ID band Patient awake    Reviewed: Allergy & Precautions, H&P , NPO status , Patient's Chart, lab work & pertinent test results, reviewed documented beta blocker date and time   Airway Mallampati: II TM Distance: >3 FB Neck ROM: Full    Dental  (+) Teeth Intact and Dental Advisory Given   Pulmonary former smoker,  breath sounds clear to auscultation        Cardiovascular hypertension, Pt. on medications Rhythm:Regular Rate:Normal     Neuro/Psych    GI/Hepatic GERD-  Medicated,  Endo/Other    Renal/GU      Musculoskeletal   Abdominal   Peds  Hematology   Anesthesia Other Findings   Reproductive/Obstetrics                          Anesthesia Physical Anesthesia Plan  ASA: II  Anesthesia Plan: General and MAC   Post-op Pain Management: MAC Combined w/ Regional for Post-op pain   Induction: Intravenous  Airway Management Planned: Natural Airway, Simple Face Mask and LMA  Additional Equipment:   Intra-op Plan:   Post-operative Plan:   Informed Consent: I have reviewed the patients History and Physical, chart, labs and discussed the procedure including the risks, benefits and alternatives for the proposed anesthesia with the patient or authorized representative who has indicated his/her understanding and acceptance.   Dental advisory given  Plan Discussed with: CRNA and Anesthesiologist  Anesthesia Plan Comments: (htn  Plan supraclavicular block with MAC)       Anesthesia Quick Evaluation

## 2013-12-01 NOTE — Anesthesia Procedure Notes (Addendum)
Procedure Name: Intubation Date/Time: 12/01/2013 10:34 AM Performed by: Brien Mates D Pre-anesthesia Checklist: Patient identified, Emergency Drugs available, Suction available, Timeout performed and Patient being monitored Patient Re-evaluated:Patient Re-evaluated prior to inductionOxygen Delivery Method: Circle system utilized Preoxygenation: Pre-oxygenation with 100% oxygen Intubation Type: IV induction Ventilation: Mask ventilation without difficulty LMA: LMA inserted LMA Size: 4.0 Number of attempts: 1 Placement Confirmation: positive ETCO2 and breath sounds checked- equal and bilateral Tube secured with: Tape Dental Injury: Teeth and Oropharynx as per pre-operative assessment     Anesthesia Regional Block:  Supraclavicular block  Pre-Anesthetic Checklist: ,, timeout performed, Correct Patient, Correct Site, Correct Laterality, Correct Procedure, Correct Position, site marked, Risks and benefits discussed,  Surgical consent,  Pre-op evaluation,  At surgeon's request and post-op pain management  Laterality: Left  Prep: chloraprep       Needles:  Injection technique: Single-shot  Needle Type: Echogenic Stimulator Needle     Needle Length:cm 9 cm Needle Gauge: 22 and 22 G    Additional Needles:  Procedures: ultrasound guided (picture in chart) Supraclavicular block Narrative:  Start time: 12/01/2013 9:30 AM End time: 12/01/2013 9:40 AM Injection made incrementally with aspirations every 5 mL.  Performed by: Personally   Additional Notes: 30 cc 0.5% marcaine 1:200 Epi injected easily

## 2013-12-01 NOTE — Progress Notes (Signed)
Consent filled out per Dr. Amanda Pea

## 2013-12-01 NOTE — H&P (Signed)
Ronald Evans is an 52 y.o. male.   Chief Complaint: Puncture/form bodies laceration to the small and ring finger with subsequent development of the leg small finger rupture about the flexor apparatus HPI: Marland KitchenMarland KitchenPatient presents for evaluation and treatment of the of their upper extremity predicament. The patient denies neck back chest or of abdominal pain. The patient notes that they have no lower extremity problems. The patient from primarily complains of the upper extremity pain noted.  Past Medical History  Diagnosis Date  . Hypertension   . GERD (gastroesophageal reflux disease)   . Anxiety     Past Surgical History  Procedure Laterality Date  . I&d extremity Left 11/09/2013    Procedure: IRRIGATION AND DEBRIDEMENT left hand, removal of foreign body;  Surgeon: Dominica Severin, MD;  Location: [facility] OR;  Service: Orthopedics;  Laterality: Left;    History reviewed. No pertinent family history. Social History:  reports that he has quit smoking. His smokeless tobacco use includes Chew. He reports that he does not drink alcohol or use illicit drugs.  Allergies: No Known Allergies  Medications Prior to Admission  Medication Sig Dispense Refill  . clonazePAM (KLONOPIN) 0.5 MG tablet Take 0.5 mg by mouth 2 (two) times daily as needed for anxiety.      [name] lisinopril-hydrochlorothiazide (PRINZIDE,ZESTORETIC) 20-12.5 MG per tablet Take 1 tablet by mouth daily.      . methocarbamol (ROBAXIN) 500 MG tablet Take 500 mg by mouth every 6 (six) hours as needed for muscle spasms.      [name] omeprazole (PRILOSEC) 20 MG capsule Take 20 mg by mouth daily.      [name] oxyCODONE (OXY IR/ROXICODONE) 5 MG immediate release tablet Take 5 mg by mouth every 4 (four) hours as needed for severe pain.      . vitamin C (ASCORBIC ACID) 500 MG tablet Take 1,000 mg by mouth daily.        Results for orders placed during the hospital encounter of 12/01/13 (from the past 48 hour(s))  BASIC METABOLIC PANEL     Status: Abnormal    Collection Time    12/01/13  8:12 AM      Result Value Range   Sodium 141  135 - 145 mEq/L   Potassium 3.9  3.5 - 5.1 mEq/L   Chloride 111  96 - 112 mEq/L   CO2 26  19 - 32 mEq/L   Glucose, Bld 142 (*) 70 - 99 mg/dL   BUN 9  6 - 23 mg/dL   Creatinine, Ser 4.09  0.50 - 1.35 mg/dL   Calcium 9.1  8.4 - 81.1 mg/dL   GFR calc non Af Amer >90  >90 mL/min   GFR calc Af Amer >90  >90 mL/min   Comment: (NOTE)     The eGFR has been calculated using the CKD EPI equation.     This calculation has not been validated in all clinical situations.     eGFR's persistently <90 mL/min signify possible Chronic Kidney     Disease.  CBC     Status: None   Collection Time    12/01/13  8:12 AM      Result Value Range   WBC 7.0  4.0 - 10.5 K/uL   RBC 4.78  4.22 - 5.81 MIL/uL   Hemoglobin 15.5  13.0 - 17.0 g/dL   HCT 91.4  78.2 - 95.6 %   MCV 90.4  78.0 - 100.0 fL   MCH 32.4  26.0 - 34.0 pg  MCHC 35.9  30.0 - 36.0 g/dL   RDW 16.1  09.6 - 04.5 %   Platelets 211  150 - 400 K/uL   Dg Chest 2 View  12/01/2013   CLINICAL DATA:  Preop chest exam  EXAM: CHEST  2 VIEW  COMPARISON:  None.  FINDINGS: The heart size and mediastinal contours are within normal limits. Both lungs are clear. The visualized skeletal structures are unremarkable.  IMPRESSION: No active cardiopulmonary disease.   Electronically Signed   By: Signa Kell M.D.   On: 12/01/2013 09:29    Review of Systems  Constitutional: Negative.   HENT: Negative.   Respiratory: Negative.   Cardiovascular: Negative.   Gastrointestinal: Negative.   Genitourinary: Negative.   Skin: Negative.   Neurological: Negative.   Endo/Heme/Allergies: Negative.   Psychiatric/Behavioral: Negative.     Blood pressure 139/78, pulse 66, temperature 98.3 F (36.8 C), resp. rate 18, height 5\' 9"  (1.753 m), weight 94.201 kg (207 lb 10.8 oz), SpO2 98.00%. Physical Exam  Loss of flexion to the small finger. Suspected FDP FDS laceration/incompetence versus adhesions  and scar tissue. Given the absolute loss of flexion a rupture is suspected. The patient did have a prior dramatic injury is noted. His sensation is intact. He understands the risk of inability to repair the tendon etc.  ..The patient is alert and oriented in no acute distress the patient complains of pain in the affected upper extremity.  The patient is noted to have a normal HEENT exam.  Lung fields show equal chest expansion and no shortness of breath  abdomen exam is nontender without distention.  Lower extremity examination does not show any fracture dislocation or blood clot symptoms.  Pelvis is stable neck and back are stable and nontender Assessment/Plan Plan for exploration, irrigation and debridement, repair is necessary with possible tendon graft left small finger. Risk benefits these notes a been discussed and all questions answered  Patient is aware of all issues and plans.  [name].We are planning surgery for your upper extremity. The risk and benefits of surgery include risk of bleeding infection anesthesia damage to normal structures and failure of the surgery to accomplish its intended goals of relieving symptoms and restoring function with this in mind we'll going to proceed. I have specifically discussed with the patient the pre-and postoperative regime and the does and don'ts and risk and benefits in great detail. Risk and benefits of surgery also include risk of dystrophy chronic nerve pain failure of the healing process to go onto completion and other inherent risks of surgery The relavent the pathophysiology of the disease/injury process, as well as the alternatives for treatment and postoperative course of action has been discussed in great detail with the patient who desires to proceed.  We will do everything in our power to help you (the patient) restore function to the upper extremity. Is a pleasure to see this patient today.   Derren Suydam III,Reedy Biernat M 12/01/2013, 10:09 AM

## 2013-12-01 NOTE — Transfer of Care (Signed)
Immediate Anesthesia Transfer of Care Note  Patient: Ronald Evans  Procedure(s) Performed: Procedure(s): EXPLORATION LEFT SMALL FINGER WITH FLEXOR TENDONS (FDP,FDS) REPAIR and neurolysis of digital nerves (Left)  Patient Location: PACU  Anesthesia Type:GA combined with regional for post-op pain  Level of Consciousness: awake  Airway & Oxygen Therapy: Patient Spontanous Breathing and Patient connected to face mask oxygen  Post-op Assessment: Report given to PACU RN and Post -op Vital signs reviewed and stable  Post vital signs: Reviewed and stable  Complications: No apparent anesthesia complications

## 2013-12-02 NOTE — Op Note (Signed)
NAMEUEL, DAVIDOW NO.:  [id number]  MEDICAL RECORD NO.:  [id number]  LOCATION:  [facility]                         FACILITY:  [facility]  PHYSICIAN:  Dionne Ano. Carlei Huang, M.D.DATE OF BIRTH:  1961/08/27  DATE OF PROCEDURE: DATE OF DISCHARGE:  12/01/2013                              OPERATIVE REPORT   PREOPERATIVE DIAGNOSIS:  Status post puncture wound to the left hand with presumed flexor rupture about the small finger.  POSTOPERATIVE DIAGNOSES: 1. Flexor digitorum profundus tendon rupture, left small finger. 2. Partial rupture to the flexor digitorum superficialis to the small     finger. 3. Severe tissue injury to the left small finger, status post puncture     wound.  SURGICAL PROCEDURES: 1. Irrigation and debridement of skin, subcutaneous tissue, tendon and     deep periosteal tissue, left small finger.  This was an excisional     debridement with curette, knife, blade, and scalpel. 2. Flexor digitorum superficialis tenolysis, tenosynovectomy, left     small finger with debridement. 3. Flexor digitorum profundus repair with 4-strand technique in zone     2. 4. Radial digital nerve neurolysis, left small finger. 5. Ulnar digital nerve neurolysis, left small finger.  SURGEON:  Dionne Ano. Amanda Pea, M.D.  ASSISTANT:  None.  COMPLICATIONS:  None.  ANESTHESIA:  General.  TOURNIQUET TIME:  Less than an hour.  INDICATIONS:  This is a 52 year old male who has a history of severe puncture wound to the ring and small finger.  The foreign body was removed and the wounds were irrigated.  He did relatively well postoperatively until days ago when he had absolute loss of motion in the small finger, representing likely a partial injury, which converted to a complete injury in terms of flexor tendon competency.  I immediately discussed him the severity and asked that we perform exploration.  Fortunately, he was improved quickly and were able to go to operative arena  today.  The patient understands risks and benefits, do's and don'ts, etc.  At present juncture, it is our hope that we can perform a primary repair; however, I have prepped him for graft in case the tendon is markedly dysfunctional.  Fortunately as seen below, we were able to perform a primary repair for this gentleman as the retraction was not excessive.  OPERATION IN DETAIL:  The patient was seen by myself and Anesthesia, taken to the operative suite, underwent a smooth induction of general anesthesia.  Time-out was called.  He was prepped and draped in usual sterile fashion with Betadine scrub and paint.  Preop antibiotics were given.  Following this, the patient then underwent a very careful and cautious approach to the finger.  Tenodesis if fact showed no motion indicating the complete FDP rupture.  I performed a modified Bruner incision.  Skin flaps were elevated.  Given the scar tissue and prior injury, we performed a very careful radial digital nerve neurolysis and swept this out of harm.  He had an entrance and exit site about the small finger, which was ulna, proximal radial distal in terms of the scar tissue and the path of the prior foreign body.  This necessitated a ulnar digital nerve  neurolysis to the small finger.  Following this, I performed a radial digital nerve neurolysis and evaluation of the neurovascular bundle under 4.0 loupe magnification as well.  Following neurolysis of the bundles and protecting these areas, we then sutured the modified Brunner flaps back and looked at the flexor sheath region.  I performed the irrigation of greater than 2-3 liters, and excisional debridement of skin, subcutaneous tissue, periosteal tissue and noted tendinous tissue.  Following this, I then made windows appropriately and notify the rupture.  The FDS had partial injury and we performed the tenolysis, tenosynovectomy and partial debridement. Portions of this were intact, but  certainly, there was damage.  Following the tenolysis and tenosynovectomy of the superficialis, we then turned attention towards the profundus.  The profundus had retracted just to the level of the MP joint and I was able to retrieve it through the incision about the finger.  I threaded it through of course the proximal portion of the A3 pulley system and did not have to sacrifice this.  Following this, I then noted that the A4 pulley system was fairly devoid of functional integrity, but given the integrity of A3 and A2, I felt competent with the primary repair.  I felt very fortunate that we were able to repair it and I did take intraoperative photos before and after for the patient.  I have performed a 4-strand repair with 3-0 FiberWire without difficulty, he tolerated this well.  Knots were tied within the substance of the tendon and following this, we performed an epitenon coverage with 6-0 Prolene.  This was a modified Kessler to [employer] and there were no complicating features.  Thus, a 4-strand repair with 6-0 Prolene epitendinous covering was performed without difficulty. Following this, pictures were taken, tourniquet deflated, irrigation applied.  The wound was then closed with Prolene of the 5-0 variety.  He was placed in a splint after Adaptic, Xeroform gauze, Webril, and Kerlix were applied.  He was placed in slight wrist flexion and fingers in flexion so as not to put any tension on the repair.  Fortunately, Dr. Noreene Larsson performed a nice block preoperatively and thus, we will not have difficulty in the most immediate recovery time with injury to our repairs.  We will see him back in the office in 6 days. We are going to go ahead and placed him on a Kleinert program for flexor tendon protocol.  We will watch the ring finger frequently; however, I did not perform any dissection of the ring finger.  He tolerated the procedure well.  We will continue oxycodone  p.r.n. pain, live work duties and I am going to place him on Keflex for antibiotic prophylaxis.  Do's and don'ts have been discussed.  All questions had been encouraged and answered.  It was an absolute pleasure to see him today.  I felt very fortunate that his repair was able to be primarily performed as opposed to a graft or Hunter rod insertion.     Dionne Ano. Amanda Pea, M.D.     [facility]  D:  12/01/2013  T:  12/02/2013  Job:  [id number]

## 2013-12-04 ENCOUNTER — Encounter (HOSPITAL_COMMUNITY): Payer: Self-pay | Admitting: Orthopedic Surgery

## 2013-12-13 NOTE — Discharge Summary (Signed)
  This is a pleasant 52 year old gentleman who underwent an irrigation and excisional debridement of the left small finger with repair of the FDP as well as radial digital nerve and ulnar digital nerve lysis. Patient tolerated procedure well and there was no complications. This was a same-day procedure and the patient was discharged home in stable condition. Please see operative report for details.

## 2015-04-08 IMAGING — CR DG CHEST 2V
2 series · 2 of 2 positions shown · non-contrast
Comparison: None.

CLINICAL DATA: Preop chest exam

EXAM:
CHEST  2 VIEW

[w chest pa]
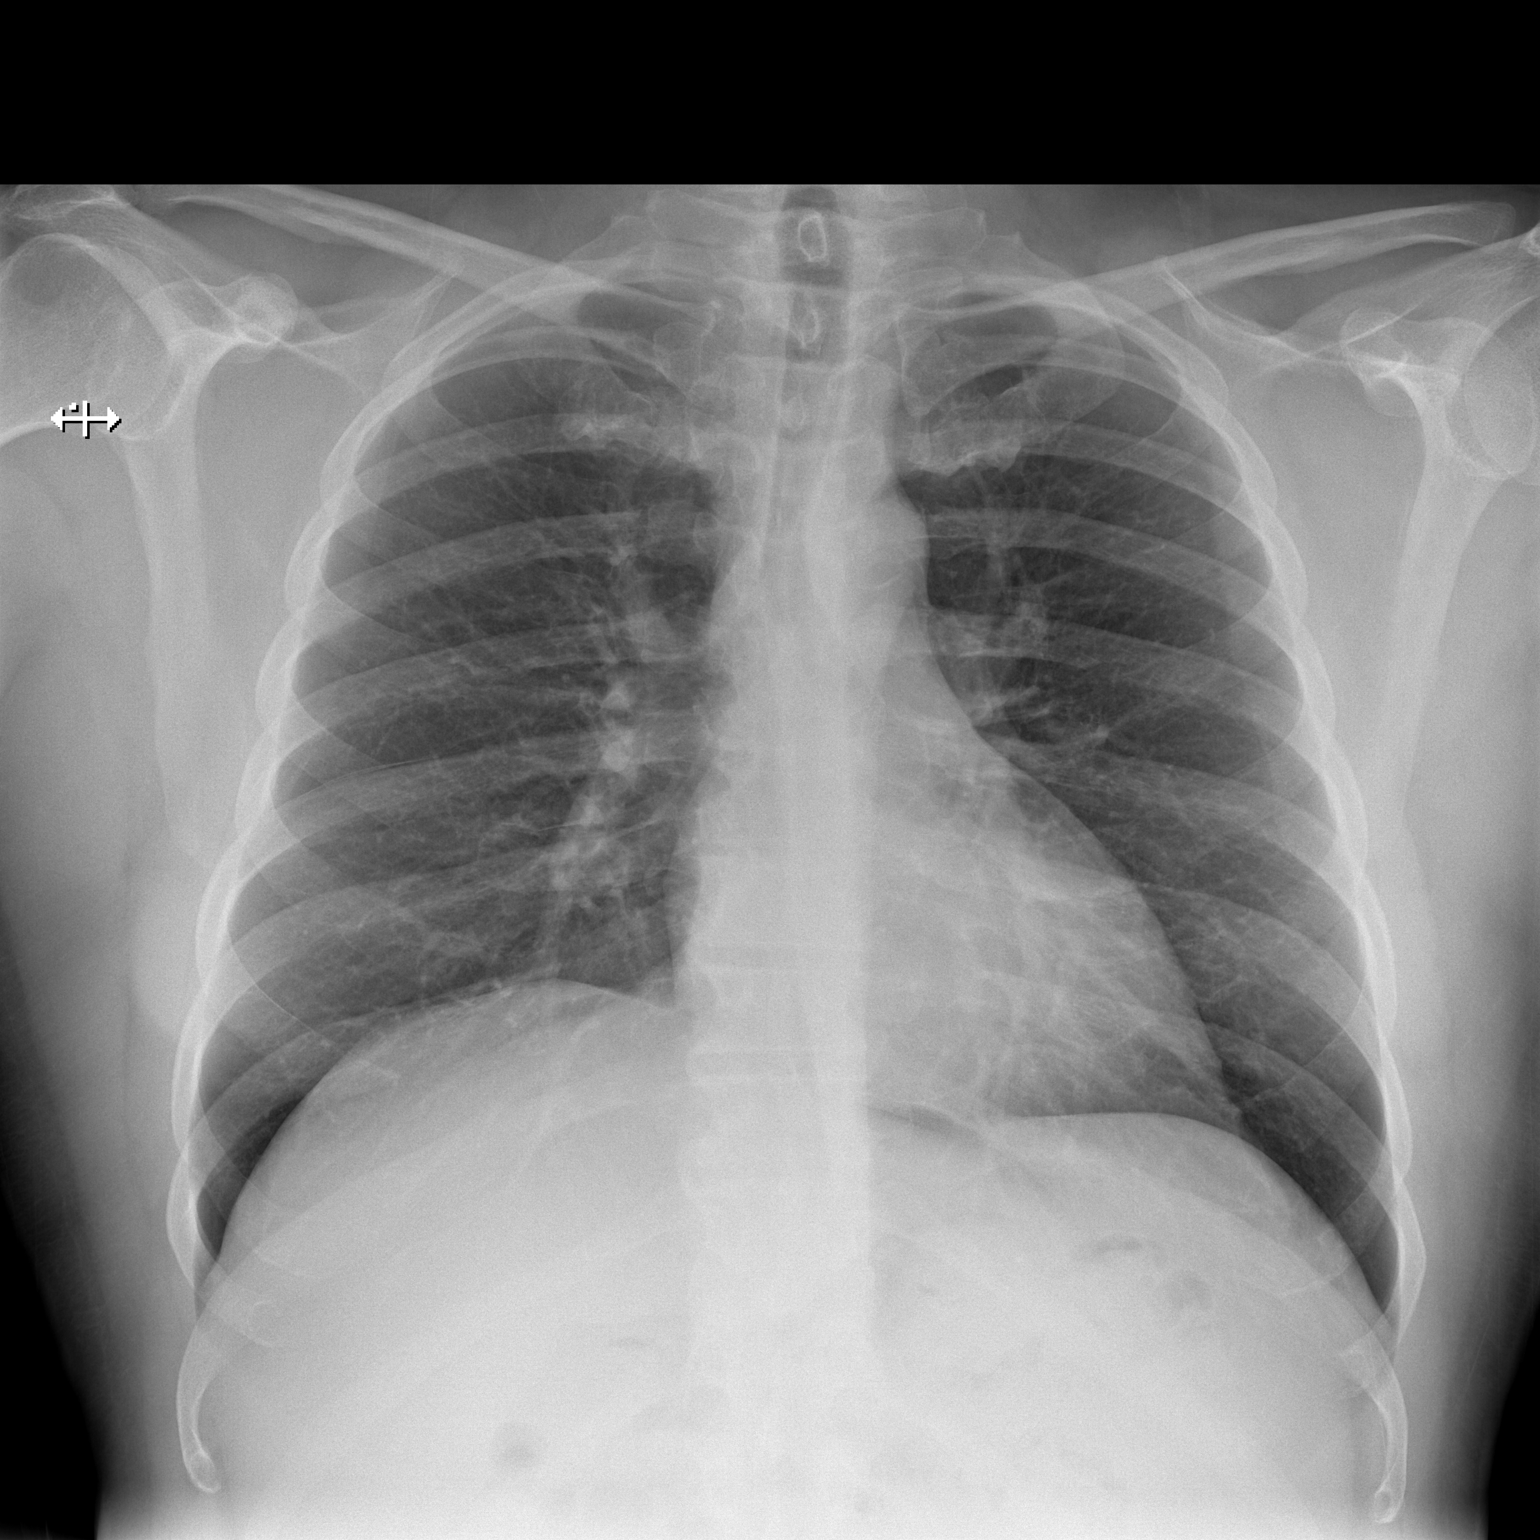

[w chest lat]
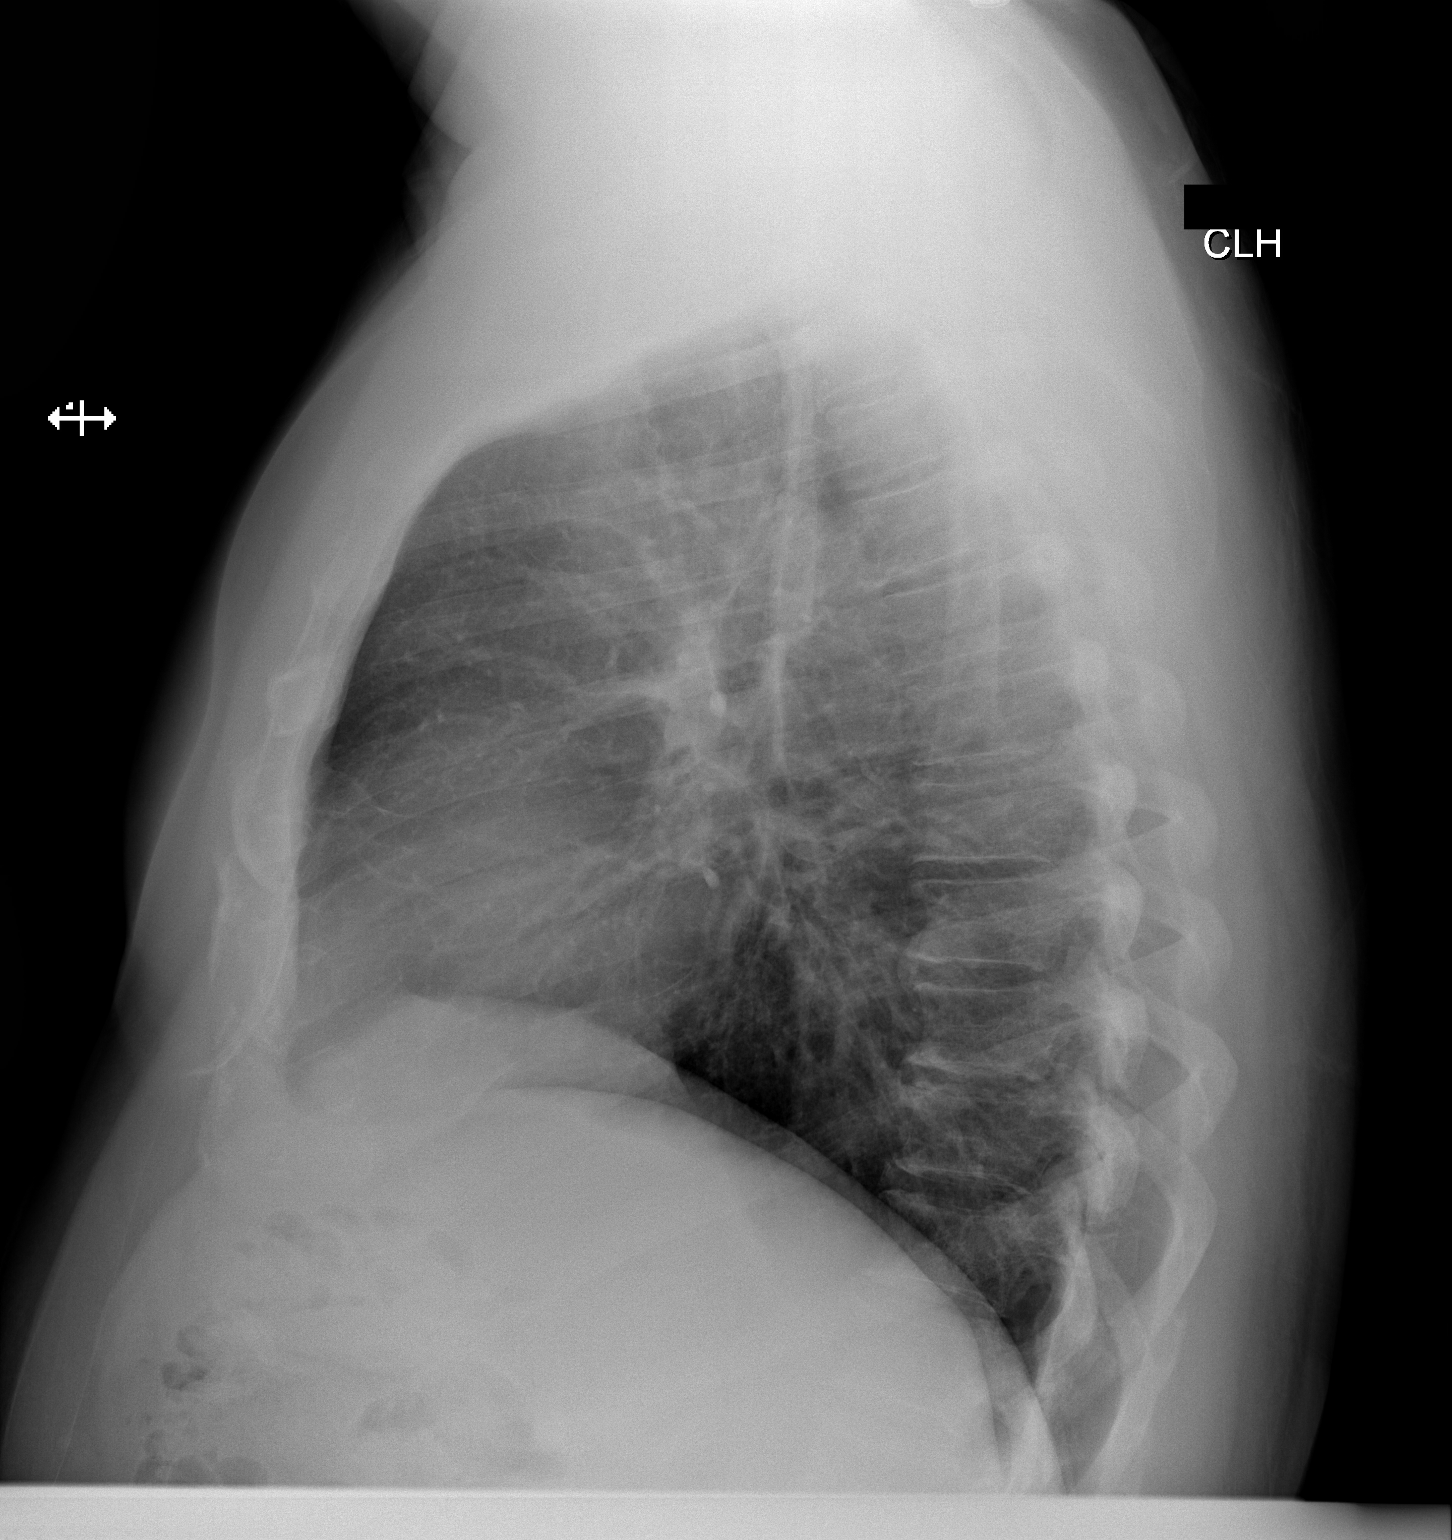

[2 of 2 positions shown; findings below may reference images not displayed]

FINDINGS: The heart size and mediastinal contours are within normal limits.
Both lungs are clear. The visualized skeletal structures are
unremarkable.
IMPRESSION: No active cardiopulmonary disease.

## 2018-03-28 DIAGNOSIS — E119 Type 2 diabetes mellitus without complications: Secondary | ICD-10-CM | POA: Diagnosis not present

## 2018-03-28 DIAGNOSIS — H40011 Open angle with borderline findings, low risk, right eye: Secondary | ICD-10-CM | POA: Diagnosis not present

## 2018-03-28 DIAGNOSIS — H2513 Age-related nuclear cataract, bilateral: Secondary | ICD-10-CM | POA: Diagnosis not present

## 2018-05-04 DIAGNOSIS — B360 Pityriasis versicolor: Secondary | ICD-10-CM | POA: Diagnosis not present

## 2018-09-01 DIAGNOSIS — Z0000 Encounter for general adult medical examination without abnormal findings: Secondary | ICD-10-CM | POA: Diagnosis not present

## 2018-09-01 DIAGNOSIS — I10 Essential (primary) hypertension: Secondary | ICD-10-CM | POA: Diagnosis not present

## 2018-09-01 DIAGNOSIS — Z13220 Encounter for screening for lipoid disorders: Secondary | ICD-10-CM | POA: Diagnosis not present

## 2018-09-01 DIAGNOSIS — R7303 Prediabetes: Secondary | ICD-10-CM | POA: Diagnosis not present

## 2018-11-08 DIAGNOSIS — M62838 Other muscle spasm: Secondary | ICD-10-CM | POA: Diagnosis not present

## 2018-11-08 DIAGNOSIS — M25512 Pain in left shoulder: Secondary | ICD-10-CM | POA: Diagnosis not present

## 2019-02-27 DIAGNOSIS — R7303 Prediabetes: Secondary | ICD-10-CM | POA: Diagnosis not present

## 2019-05-28 DIAGNOSIS — B360 Pityriasis versicolor: Secondary | ICD-10-CM | POA: Diagnosis not present

## 2019-05-28 DIAGNOSIS — L508 Other urticaria: Secondary | ICD-10-CM | POA: Diagnosis not present

## 2019-05-28 DIAGNOSIS — L980 Pyogenic granuloma: Secondary | ICD-10-CM | POA: Diagnosis not present

## 2019-09-10 DIAGNOSIS — Z0000 Encounter for general adult medical examination without abnormal findings: Secondary | ICD-10-CM | POA: Diagnosis not present

## 2019-09-10 DIAGNOSIS — Z13220 Encounter for screening for lipoid disorders: Secondary | ICD-10-CM | POA: Diagnosis not present

## 2019-09-10 DIAGNOSIS — R7303 Prediabetes: Secondary | ICD-10-CM | POA: Diagnosis not present

## 2019-09-10 DIAGNOSIS — Z1211 Encounter for screening for malignant neoplasm of colon: Secondary | ICD-10-CM | POA: Diagnosis not present

## 2020-03-06 DIAGNOSIS — I10 Essential (primary) hypertension: Secondary | ICD-10-CM | POA: Diagnosis not present

## 2020-03-06 DIAGNOSIS — E119 Type 2 diabetes mellitus without complications: Secondary | ICD-10-CM | POA: Diagnosis not present

## 2020-05-22 DIAGNOSIS — Z03818 Encounter for observation for suspected exposure to other biological agents ruled out: Secondary | ICD-10-CM | POA: Diagnosis not present

## 2020-05-22 DIAGNOSIS — Z20828 Contact with and (suspected) exposure to other viral communicable diseases: Secondary | ICD-10-CM | POA: Diagnosis not present

## 2020-05-23 DIAGNOSIS — Z20828 Contact with and (suspected) exposure to other viral communicable diseases: Secondary | ICD-10-CM | POA: Diagnosis not present

## 2020-05-23 DIAGNOSIS — Z20822 Contact with and (suspected) exposure to covid-19: Secondary | ICD-10-CM | POA: Diagnosis not present

## 2020-08-25 DIAGNOSIS — Z20822 Contact with and (suspected) exposure to covid-19: Secondary | ICD-10-CM | POA: Diagnosis not present

## 2020-09-12 DIAGNOSIS — Z13220 Encounter for screening for lipoid disorders: Secondary | ICD-10-CM | POA: Diagnosis not present

## 2020-09-12 DIAGNOSIS — R7303 Prediabetes: Secondary | ICD-10-CM | POA: Diagnosis not present

## 2020-09-12 DIAGNOSIS — Z0000 Encounter for general adult medical examination without abnormal findings: Secondary | ICD-10-CM | POA: Diagnosis not present

## 2020-10-23 DIAGNOSIS — E119 Type 2 diabetes mellitus without complications: Secondary | ICD-10-CM | POA: Diagnosis not present

## 2020-10-23 DIAGNOSIS — H2513 Age-related nuclear cataract, bilateral: Secondary | ICD-10-CM | POA: Diagnosis not present

## 2020-10-23 DIAGNOSIS — H40011 Open angle with borderline findings, low risk, right eye: Secondary | ICD-10-CM | POA: Diagnosis not present

## 2020-12-01 DIAGNOSIS — Z03818 Encounter for observation for suspected exposure to other biological agents ruled out: Secondary | ICD-10-CM | POA: Diagnosis not present

## 2020-12-01 DIAGNOSIS — Z20822 Contact with and (suspected) exposure to covid-19: Secondary | ICD-10-CM | POA: Diagnosis not present

## 2020-12-04 DIAGNOSIS — Z20822 Contact with and (suspected) exposure to covid-19: Secondary | ICD-10-CM | POA: Diagnosis not present

## 2020-12-18 DIAGNOSIS — E785 Hyperlipidemia, unspecified: Secondary | ICD-10-CM | POA: Diagnosis not present

## 2021-02-16 DIAGNOSIS — Z03818 Encounter for observation for suspected exposure to other biological agents ruled out: Secondary | ICD-10-CM | POA: Diagnosis not present

## 2021-02-16 DIAGNOSIS — Z20822 Contact with and (suspected) exposure to covid-19: Secondary | ICD-10-CM | POA: Diagnosis not present

## 2021-03-13 DIAGNOSIS — E785 Hyperlipidemia, unspecified: Secondary | ICD-10-CM | POA: Diagnosis not present

## 2021-03-13 DIAGNOSIS — I10 Essential (primary) hypertension: Secondary | ICD-10-CM | POA: Diagnosis not present

## 2021-03-13 DIAGNOSIS — R7303 Prediabetes: Secondary | ICD-10-CM | POA: Diagnosis not present

## 2021-03-18 DIAGNOSIS — Z1211 Encounter for screening for malignant neoplasm of colon: Secondary | ICD-10-CM | POA: Diagnosis not present

## 2021-06-02 DIAGNOSIS — E119 Type 2 diabetes mellitus without complications: Secondary | ICD-10-CM | POA: Diagnosis not present

## 2021-09-15 DIAGNOSIS — Z0000 Encounter for general adult medical examination without abnormal findings: Secondary | ICD-10-CM | POA: Diagnosis not present

## 2021-09-15 DIAGNOSIS — E785 Hyperlipidemia, unspecified: Secondary | ICD-10-CM | POA: Diagnosis not present

## 2021-09-15 DIAGNOSIS — E119 Type 2 diabetes mellitus without complications: Secondary | ICD-10-CM | POA: Diagnosis not present

## 2021-09-15 DIAGNOSIS — Z125 Encounter for screening for malignant neoplasm of prostate: Secondary | ICD-10-CM | POA: Diagnosis not present

## 2022-01-18 DIAGNOSIS — Z7984 Long term (current) use of oral hypoglycemic drugs: Secondary | ICD-10-CM | POA: Diagnosis not present

## 2022-01-18 DIAGNOSIS — E119 Type 2 diabetes mellitus without complications: Secondary | ICD-10-CM | POA: Diagnosis not present

## 2022-01-18 DIAGNOSIS — H2513 Age-related nuclear cataract, bilateral: Secondary | ICD-10-CM | POA: Diagnosis not present

## 2022-09-17 DIAGNOSIS — I10 Essential (primary) hypertension: Secondary | ICD-10-CM | POA: Diagnosis not present

## 2022-09-17 DIAGNOSIS — E119 Type 2 diabetes mellitus without complications: Secondary | ICD-10-CM | POA: Diagnosis not present

## 2022-09-17 DIAGNOSIS — Z0000 Encounter for general adult medical examination without abnormal findings: Secondary | ICD-10-CM | POA: Diagnosis not present

## 2022-09-17 DIAGNOSIS — E785 Hyperlipidemia, unspecified: Secondary | ICD-10-CM | POA: Diagnosis not present

## 2022-09-17 DIAGNOSIS — Z125 Encounter for screening for malignant neoplasm of prostate: Secondary | ICD-10-CM | POA: Diagnosis not present

## 2022-09-27 DIAGNOSIS — Z1211 Encounter for screening for malignant neoplasm of colon: Secondary | ICD-10-CM | POA: Diagnosis not present

## 2022-09-27 DIAGNOSIS — Z1212 Encounter for screening for malignant neoplasm of rectum: Secondary | ICD-10-CM | POA: Diagnosis not present

## 2022-11-05 DIAGNOSIS — L0291 Cutaneous abscess, unspecified: Secondary | ICD-10-CM | POA: Diagnosis not present

## 2022-11-11 DIAGNOSIS — S70361D Insect bite (nonvenomous), right thigh, subsequent encounter: Secondary | ICD-10-CM | POA: Diagnosis not present

## 2023-05-25 DIAGNOSIS — Z7984 Long term (current) use of oral hypoglycemic drugs: Secondary | ICD-10-CM | POA: Diagnosis not present

## 2023-05-25 DIAGNOSIS — H40011 Open angle with borderline findings, low risk, right eye: Secondary | ICD-10-CM | POA: Diagnosis not present

## 2023-05-25 DIAGNOSIS — E119 Type 2 diabetes mellitus without complications: Secondary | ICD-10-CM | POA: Diagnosis not present

## 2023-05-25 DIAGNOSIS — H2513 Age-related nuclear cataract, bilateral: Secondary | ICD-10-CM | POA: Diagnosis not present

## 2023-09-20 DIAGNOSIS — I10 Essential (primary) hypertension: Secondary | ICD-10-CM | POA: Diagnosis not present

## 2023-09-20 DIAGNOSIS — E785 Hyperlipidemia, unspecified: Secondary | ICD-10-CM | POA: Diagnosis not present

## 2023-09-20 DIAGNOSIS — Z125 Encounter for screening for malignant neoplasm of prostate: Secondary | ICD-10-CM | POA: Diagnosis not present

## 2023-09-20 DIAGNOSIS — Z0000 Encounter for general adult medical examination without abnormal findings: Secondary | ICD-10-CM | POA: Diagnosis not present

## 2023-09-20 DIAGNOSIS — F411 Generalized anxiety disorder: Secondary | ICD-10-CM | POA: Diagnosis not present

## 2023-09-20 DIAGNOSIS — E119 Type 2 diabetes mellitus without complications: Secondary | ICD-10-CM | POA: Diagnosis not present

## 2024-06-12 DIAGNOSIS — H2513 Age-related nuclear cataract, bilateral: Secondary | ICD-10-CM | POA: Diagnosis not present

## 2024-06-12 DIAGNOSIS — H524 Presbyopia: Secondary | ICD-10-CM | POA: Diagnosis not present

## 2024-06-12 DIAGNOSIS — E119 Type 2 diabetes mellitus without complications: Secondary | ICD-10-CM | POA: Diagnosis not present

## 2024-06-12 DIAGNOSIS — Z7984 Long term (current) use of oral hypoglycemic drugs: Secondary | ICD-10-CM | POA: Diagnosis not present

## 2024-09-20 DIAGNOSIS — Z125 Encounter for screening for malignant neoplasm of prostate: Secondary | ICD-10-CM | POA: Diagnosis not present

## 2024-09-20 DIAGNOSIS — E785 Hyperlipidemia, unspecified: Secondary | ICD-10-CM | POA: Diagnosis not present

## 2024-09-20 DIAGNOSIS — Z23 Encounter for immunization: Secondary | ICD-10-CM | POA: Diagnosis not present

## 2024-09-20 DIAGNOSIS — E119 Type 2 diabetes mellitus without complications: Secondary | ICD-10-CM | POA: Diagnosis not present

## 2024-09-20 DIAGNOSIS — Z0000 Encounter for general adult medical examination without abnormal findings: Secondary | ICD-10-CM | POA: Diagnosis not present

## 2024-09-20 DIAGNOSIS — I10 Essential (primary) hypertension: Secondary | ICD-10-CM | POA: Diagnosis not present

## 2024-09-20 DIAGNOSIS — K219 Gastro-esophageal reflux disease without esophagitis: Secondary | ICD-10-CM | POA: Diagnosis not present

## 2024-11-06 DIAGNOSIS — I10 Essential (primary) hypertension: Secondary | ICD-10-CM | POA: Diagnosis not present

## 2024-11-06 DIAGNOSIS — E119 Type 2 diabetes mellitus without complications: Secondary | ICD-10-CM | POA: Diagnosis not present

## 2024-11-06 DIAGNOSIS — Z6830 Body mass index (BMI) 30.0-30.9, adult: Secondary | ICD-10-CM | POA: Diagnosis not present

## 2024-11-06 DIAGNOSIS — E785 Hyperlipidemia, unspecified: Secondary | ICD-10-CM | POA: Diagnosis not present
